# Patient Record
Sex: Male | Born: 1995 | Hispanic: No | Marital: Single | Smoking: Current every day smoker
Health system: Southern US, Community
[De-identification: ages and names within clinical notes are randomized; demographics above are authoritative.]

---
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
## 2017-03-25 ENCOUNTER — Emergency Department (HOSPITAL_BASED_OUTPATIENT_CLINIC_OR_DEPARTMENT_OTHER)
Admission: EM | Admit: 2017-03-25 | Discharge: 2017-03-25 | Discharge status: Against Medical Advice | Payer: Self-pay | Attending: Emergency Medicine | Admitting: Emergency Medicine

## 2017-03-25 ENCOUNTER — Other Ambulatory Visit: Payer: Self-pay

## 2017-03-25 ENCOUNTER — Encounter (HOSPITAL_BASED_OUTPATIENT_CLINIC_OR_DEPARTMENT_OTHER): Payer: Self-pay | Admitting: *Deleted

## 2017-03-25 DIAGNOSIS — Z5321 Procedure and treatment not carried out due to patient leaving prior to being seen by health care provider: Secondary | ICD-10-CM | POA: Insufficient documentation

## 2017-03-25 DIAGNOSIS — R21 Rash and other nonspecific skin eruption: Secondary | ICD-10-CM | POA: Insufficient documentation

## 2017-03-25 NOTE — ED Notes (Signed)
Pt called to treatment room with no answer from lobby.  

## 2017-03-25 NOTE — ED Triage Notes (Signed)
Pt c/o rash to entire body x 3 weeks
